# Patient Record
Sex: Female | Born: 2005 | Hispanic: Yes | Marital: Single | Smoking: Never smoker
Health system: Southern US, Community
[De-identification: ages and names within clinical notes are randomized; demographics above are authoritative.]

## PROBLEM LIST (undated history)

## (undated) DIAGNOSIS — T7840XA Allergy, unspecified, initial encounter: Secondary | ICD-10-CM

## (undated) DIAGNOSIS — E039 Hypothyroidism, unspecified: Secondary | ICD-10-CM

## (undated) HISTORY — PX: NO PAST SURGERIES: SHX2092

## (undated) HISTORY — DX: Allergy, unspecified, initial encounter: T78.40XA

## (undated) HISTORY — DX: Hypothyroidism, unspecified: E03.9

---
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
## 2024-02-20 ENCOUNTER — Ambulatory Visit: Admitting: Family

## 2024-02-20 ENCOUNTER — Encounter: Payer: Self-pay | Admitting: Family

## 2024-02-20 VITALS — BP 110/76 | HR 101 | Temp 99.0°F | Ht 63.0 in | Wt 164.2 lb

## 2024-02-20 DIAGNOSIS — L989 Disorder of the skin and subcutaneous tissue, unspecified: Secondary | ICD-10-CM

## 2024-02-20 DIAGNOSIS — F4000 Agoraphobia, unspecified: Secondary | ICD-10-CM | POA: Diagnosis not present

## 2024-02-20 DIAGNOSIS — R142 Eructation: Secondary | ICD-10-CM

## 2024-02-20 DIAGNOSIS — D841 Defects in the complement system: Secondary | ICD-10-CM | POA: Diagnosis not present

## 2024-02-20 DIAGNOSIS — N926 Irregular menstruation, unspecified: Secondary | ICD-10-CM

## 2024-02-20 DIAGNOSIS — R7989 Other specified abnormal findings of blood chemistry: Secondary | ICD-10-CM

## 2024-02-20 DIAGNOSIS — K2100 Gastro-esophageal reflux disease with esophagitis, without bleeding: Secondary | ICD-10-CM

## 2024-02-20 DIAGNOSIS — E049 Nontoxic goiter, unspecified: Secondary | ICD-10-CM

## 2024-02-20 DIAGNOSIS — R5383 Other fatigue: Secondary | ICD-10-CM

## 2024-02-20 DIAGNOSIS — E063 Autoimmune thyroiditis: Secondary | ICD-10-CM

## 2024-02-20 DIAGNOSIS — F419 Anxiety disorder, unspecified: Secondary | ICD-10-CM | POA: Diagnosis not present

## 2024-02-20 DIAGNOSIS — E039 Hypothyroidism, unspecified: Secondary | ICD-10-CM | POA: Insufficient documentation

## 2024-02-20 LAB — COMPREHENSIVE METABOLIC PANEL WITH GFR
ALT: 7 U/L (ref 0–35)
AST: 13 U/L (ref 0–37)
Albumin: 4.5 g/dL (ref 3.5–5.2)
Alkaline Phosphatase: 105 U/L (ref 47–119)
BUN: 11 mg/dL (ref 6–23)
CO2: 28 meq/L (ref 19–32)
Calcium: 9.6 mg/dL (ref 8.4–10.5)
Chloride: 102 meq/L (ref 96–112)
Creatinine, Ser: 0.77 mg/dL (ref 0.40–1.20)
GFR: 111.64 mL/min (ref >60.00)
Glucose, Bld: 96 mg/dL (ref 70–99)
Potassium: 3.7 meq/L (ref 3.5–5.1)
Sodium: 138 meq/L (ref 135–145)
Total Bilirubin: 0.3 mg/dL (ref 0.2–1.2)
Total Protein: 7.4 g/dL (ref 6.0–8.3)

## 2024-02-20 LAB — TSH: TSH: 7.75 u[IU]/mL — ABNORMAL HIGH (ref 0.40–5.00)

## 2024-02-20 LAB — CBC
HCT: 35.5 % — ABNORMAL LOW (ref 36.0–49.0)
Hemoglobin: 11.3 g/dL — ABNORMAL LOW (ref 12.0–16.0)
MCHC: 31.9 g/dL (ref 31.0–37.0)
MCV: 77.8 fl — ABNORMAL LOW (ref 78.0–98.0)
Platelets: 347 K/uL (ref 150.0–575.0)
RBC: 4.57 Mil/uL (ref 3.80–5.70)
RDW: 16.4 % — ABNORMAL HIGH (ref 11.4–15.5)
WBC: 6.2 K/uL (ref 4.5–13.5)

## 2024-02-20 LAB — T4, FREE: Free T4: 1.01 ng/dL (ref 0.60–1.60)

## 2024-02-20 MED ORDER — FAMOTIDINE 10 MG PO TABS: ORAL_TABLET | ORAL | 0 refills | Status: AC

## 2024-02-20 NOTE — Patient Instructions (Signed)
  A referral was placed today for both psychiatry or psychology  Please let us  know if you have not heard back within 2 weeks about the referral.

## 2024-02-20 NOTE — Progress Notes (Signed)
 Established Patient Office Visit  Subjective:      CC:  Chief Complaint  Patient presents with   Establish Care    HPI: Stacy Holmes is a 18 y.o. female presenting on 02/20/2024 for Establish Care .  Discussed the use of AI scribe software for clinical note transcription with the patient, who gave verbal consent to proceed.  History of Present Illness Stacy Holmes is a 18 year old female with hypothyroidism and anxiety who presents for management of her thyroid medication and anxiety.  She has a history of hypothyroidism diagnosed in 2020 and is currently taking levothyroxine 88 mcg daily. She experiences irregular menstrual cycles, occurring once every three months, but notes she has been more regular recently. She has not had recent thyroid labs done due to anxiety about leaving the house and has missed taking her levothyroxine for about a week this month.  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 She takes Haegarda infusions twice weekly and icatibant as needed for emergencies. Since starting these treatments, she has not experienced significant swelling.  She experiences significant anxiety, which makes it difficult for her to leave the house for appointments, contributing to missed lab work. She has a history of seeing a therapist but found the experience unhelpful due to a mismatch in therapist style. She wants to seek medication to manage her anxiety.  She reports symptoms of gastroesophageal reflux disease (GERD), including frequent burping and acid reflux, especially after consuming spicy foods. She sometimes experiences stomach pain, which she attributes to her hereditary angioedema.  She has experienced episodes of high blood pressure, noted during dental visits and medical appointments, which may be related to her anxiety.         Social history:  Relevant past medical, surgical, family and social history reviewed  and updated as indicated. Interim medical history since our last visit reviewed.  Allergies and medications reviewed and updated.  DATA REVIEWED: CHART IN EPIC     ROS: Negative unless specifically indicated above in HPI.    Current Outpatient Medications:    famotidine (PEPCID AC) 10 MG tablet, Take one tablet bid prn acid reflux, Disp: 60 tablet, Rfl: 0   HAEGARDA 2000 units SOLR, Inject into the skin., Disp: , Rfl:    icatibant Acetate (FIRAZYR) 30 MG/3ML injection, Inject 30 mg into the skin once., Disp: , Rfl:    levothyroxine (SYNTHROID) 88 MCG tablet, Take 88 mcg by mouth daily., Disp: , Rfl:    cetirizine (ZYRTEC) 10 MG tablet, Take 10 mg by mouth daily., Disp: , Rfl:         Objective:        BP 110/76 (BP Location: Left Arm, Patient Position: Sitting, Cuff Size: Normal)   Pulse (!) 101   Temp 99 F (37.2 C) (Temporal)   Ht 5' 3 (1.6 m)   Wt 164 lb 3.2 oz (74.5 kg)   SpO2 99%   BMI 29.09 kg/m   Physical Exam VITALS: BP- 110/76  Wt Readings from Last 3 Encounters:  02/20/24 164 lb 3.2 oz (74.5 kg) (89%, Z= 1.25)*  07/02/21 158 lb 10.6 oz (72 kg) (90%, Z= 1.30)*   * Growth percentiles are based on CDC (Girls, 2-20 Years) data.    Physical Exam Vitals reviewed.  Constitutional:      General: She is not in acute distress.    Appearance: Normal appearance. She is normal weight. She is not ill-appearing, toxic-appearing or diaphoretic.  HENT:  Head: Normocephalic.  Neck:     Thyroid: Thyromegaly present.  Cardiovascular:     Rate and Rhythm: Normal rate and regular rhythm.  Pulmonary:     Effort: Pulmonary effort is normal.     Breath sounds: Normal breath sounds.  Abdominal:     General: Abdomen is flat.     Palpations: Abdomen is soft.     Tenderness: There is abdominal tenderness in the epigastric area.  Musculoskeletal:        General: Normal range of motion.  Neurological:     General: No focal deficit present.     Mental Status: She  is alert and oriented to person, place, and time. Mental status is at baseline.  Psychiatric:        Mood and Affect: Mood normal.        Behavior: Behavior normal.        Thought Content: Thought content normal.        Judgment: Judgment normal.          Results RADIOLOGY CT neck and soft tissue: Thyroid gland normal (07/03/2021)  Assessment & Plan:   Assessment and Plan Assessment & Plan Hashimoto's thyroiditis with hypothyroidism Hashimoto's thyroiditis diagnosed in 2020. Currently on levothyroxine 88 mcg with irregular intake recently. 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 No recent episodes of swelling reported. - Continue Haegarda infusions twice weekly as directed by allergist - Use icatibant as needed for emergency swelling - Regular follow-up with allergist  Generalized anxiety disorder with agoraphobic features Significant anxiety with agoraphobic features, exacerbated by COVID-19 pandemic. No current psychiatric or therapeutic support. Previous therapy was ineffective due to mismatch with therapist. Expressed desire for medication and therapy. - Refer to psychiatrist for evaluation and management - Refer to therapist for ongoing therapy - Consider in-person therapy sessions to encourage leaving the house -likely would benefit from medication treatment plan   Gastroesophageal reflux disease (GERD) Frequent acid reflux, exacerbated by spicy foods and hunger. No significant stomach pain reported. Possible H. pylori infection to be ruled out. - Order H. pylori breath test - Prescribe famotidine (Pepcid) as needed, up to twice daily for two weeks - Advise dietary modifications to avoid trigger foods  Irregular menstrual cycles Irregular menstrual cycles,  possibly related to thyroid dysfunction. - Check TSH and free T4 levels today - Monitor menstrual cycle regularity  Constipation, intermittent Intermittent constipation, possibly related to dietary habits or anxiety.  Elevated blood pressure, episodic Episodic elevated blood pressure, possibly related to anxiety. Normal blood pressure reading today. - Advise home blood pressure monitoring with regular records - Reassess if elevated readings persist  General Health Maintenance Discussed meningococcal B vaccine due to potential future college attendance and crowded living conditions. Considered dermatology referral for skin screening due to numerous moles and freckles. - Discuss meningococcal B vaccine at future visits - Consider dermatology referral for skin screening due to numerous moles and freckles  Recording duration: 27 minutes      Return in about 6 months (around 08/19/2024) for f/u thyroid .     Ginger Patrick, MSN, APRN, FNP-C [facility]

## 2024-02-21 LAB — H. PYLORI BREATH TEST: H. pylori Breath Test: NOT DETECTED

## 2024-02-28 ENCOUNTER — Ambulatory Visit: Payer: Self-pay | Admitting: Family

## 2024-02-28 DIAGNOSIS — E063 Autoimmune thyroiditis: Secondary | ICD-10-CM

## 2024-02-28 DIAGNOSIS — D500 Iron deficiency anemia secondary to blood loss (chronic): Secondary | ICD-10-CM

## 2024-02-28 MED ORDER — LEVOTHYROXINE SODIUM 88 MCG PO TABS: 88.0000 ug | ORAL_TABLET | Freq: Every day | ORAL | 3 refills | Status: AC

## 2024-02-28 NOTE — Addendum Note (Signed)
 Addended by: CORWIN ANTU on: 02/28/2024 06:20 PM   Modules accepted: Orders

## 2024-03-14 ENCOUNTER — Ambulatory Visit

## 2024-04-04 ENCOUNTER — Other Ambulatory Visit (INDEPENDENT_AMBULATORY_CARE_PROVIDER_SITE_OTHER)

## 2024-04-04 DIAGNOSIS — D500 Iron deficiency anemia secondary to blood loss (chronic): Secondary | ICD-10-CM

## 2024-04-04 DIAGNOSIS — E063 Autoimmune thyroiditis: Secondary | ICD-10-CM | POA: Diagnosis not present

## 2024-04-04 LAB — CBC
HCT: 35.7 % — ABNORMAL LOW (ref 36.0–49.0)
Hemoglobin: 11.8 g/dL — ABNORMAL LOW (ref 12.0–16.0)
MCHC: 33 g/dL (ref 31.0–37.0)
MCV: 78 fl (ref 78.0–98.0)
Platelets: 353 K/uL (ref 150.0–575.0)
RBC: 4.58 Mil/uL (ref 3.80–5.70)
RDW: 16.6 % — ABNORMAL HIGH (ref 11.4–15.5)
WBC: 8.2 K/uL (ref 4.5–13.5)

## 2024-04-04 LAB — IBC + FERRITIN
Ferritin: 5.4 ng/mL — ABNORMAL LOW (ref 10.0–291.0)
Iron: 28 ug/dL — ABNORMAL LOW (ref 42–145)
Saturation Ratios: 6.4 % — ABNORMAL LOW (ref 20.0–50.0)
TIBC: 435.4 ug/dL (ref 250.0–450.0)
Transferrin: 311 mg/dL (ref 212.0–360.0)

## 2024-04-04 LAB — TSH: TSH: 1.13 u[IU]/mL (ref 0.40–5.00)

## 2024-04-04 LAB — T4, FREE: Free T4: 1.03 ng/dL (ref 0.60–1.60)

## 2024-04-05 ENCOUNTER — Ambulatory Visit: Payer: Self-pay | Admitting: Family

## 2024-04-05 DIAGNOSIS — D500 Iron deficiency anemia secondary to blood loss (chronic): Secondary | ICD-10-CM
